# Patient Record
Sex: Female | Born: 1990 | Race: Black or African American | Hispanic: No | Marital: Married | Smoking: Never smoker
Health system: Southern US, Community
[De-identification: ages and names within clinical notes are randomized; demographics above are authoritative.]

## PROBLEM LIST (undated history)

## (undated) DIAGNOSIS — G039 Meningitis, unspecified: Secondary | ICD-10-CM

## (undated) DIAGNOSIS — B019 Varicella without complication: Secondary | ICD-10-CM

## (undated) DIAGNOSIS — L509 Urticaria, unspecified: Secondary | ICD-10-CM

## (undated) HISTORY — DX: Urticaria, unspecified: L50.9

## (undated) HISTORY — PX: HERNIA REPAIR: SHX51

## (undated) HISTORY — DX: Varicella without complication: B01.9

## (undated) HISTORY — PX: OTHER SURGICAL HISTORY: SHX169

---

## 2017-11-01 ENCOUNTER — Encounter (HOSPITAL_COMMUNITY): Payer: Self-pay

## 2017-11-01 ENCOUNTER — Emergency Department (HOSPITAL_COMMUNITY)
Admission: EM | Admit: 2017-11-01 | Discharge: 2017-11-01 | Disposition: A | Discharge status: Home / Self Care | Payer: Self-pay | Attending: Emergency Medicine | Admitting: Emergency Medicine

## 2017-11-01 ENCOUNTER — Other Ambulatory Visit: Payer: Self-pay

## 2017-11-01 DIAGNOSIS — J111 Influenza due to unidentified influenza virus with other respiratory manifestations: Secondary | ICD-10-CM | POA: Insufficient documentation

## 2017-11-01 HISTORY — DX: Meningitis, unspecified: G03.9

## 2017-11-01 LAB — COMPREHENSIVE METABOLIC PANEL
ALT: 12 U/L — ABNORMAL LOW (ref 14–54)
ANION GAP: 9 (ref 5–15)
AST: 22 U/L (ref 15–41)
Albumin: 4.3 g/dL (ref 3.5–5.0)
Alkaline Phosphatase: 52 U/L (ref 38–126)
BUN: 9 mg/dL (ref 6–20)
CHLORIDE: 102 mmol/L (ref 101–111)
CO2: 24 mmol/L (ref 22–32)
Calcium: 9.4 mg/dL (ref 8.9–10.3)
Creatinine, Ser: 0.89 mg/dL (ref 0.44–1.00)
Glucose, Bld: 88 mg/dL (ref 65–99)
POTASSIUM: 3.7 mmol/L (ref 3.5–5.1)
Sodium: 135 mmol/L (ref 135–145)
Total Bilirubin: 0.4 mg/dL (ref 0.3–1.2)
Total Protein: 8 g/dL (ref 6.5–8.1)

## 2017-11-01 LAB — CBC
HEMATOCRIT: 39.6 % (ref 36.0–46.0)
Hemoglobin: 13.8 g/dL (ref 12.0–15.0)
MCH: 31.3 pg (ref 26.0–34.0)
MCHC: 34.8 g/dL (ref 30.0–36.0)
MCV: 89.8 fL (ref 78.0–100.0)
Platelets: 203 10*3/uL (ref 150–400)
RBC: 4.41 MIL/uL (ref 3.87–5.11)
RDW: 12.6 % (ref 11.5–15.5)
WBC: 4.5 10*3/uL (ref 4.0–10.5)

## 2017-11-01 LAB — URINALYSIS, ROUTINE W REFLEX MICROSCOPIC
BILIRUBIN URINE: NEGATIVE
Glucose, UA: NEGATIVE mg/dL
HGB URINE DIPSTICK: NEGATIVE
KETONES UR: 5 mg/dL — AB
NITRITE: NEGATIVE
PH: 5 (ref 5.0–8.0)
Protein, ur: NEGATIVE mg/dL
Specific Gravity, Urine: 1.016 (ref 1.005–1.030)

## 2017-11-01 LAB — LIPASE, BLOOD: LIPASE: 27 U/L (ref 11–51)

## 2017-11-01 LAB — I-STAT BETA HCG BLOOD, ED (MC, WL, AP ONLY): I-stat hCG, quantitative: <5 m[IU]/mL (ref <5)

## 2017-11-01 MED ORDER — ONDANSETRON HCL 8 MG PO TABS: 8.0000 mg | ORAL_TABLET | Freq: Three times a day (TID) | ORAL | 0 refills | Status: DC | PRN

## 2017-11-01 NOTE — ED Provider Notes (Signed)
[facility] EMERGENCY DEPARTMENT Provider Note   CSN: [id number] Arrival date & time: 11/01/17  4098     History   Chief Complaint Chief Complaint  Patient presents with  . Emesis  . Generalized Body Aches    HPI Kristine Curry is a 27 y.o. female.  She has been ill for 3 days with fever, achiness, cough, nausea and vomiting.  She gets transient improvement after taking Tylenol.  She is worried that she has meningitis because she had that 2 years ago, treated in [place], [place].  There are no other known modifying factors.  HPI  Past Medical History:  Diagnosis Date  . Meningitis     There are no active problems to display for this patient.   Past Surgical History:  Procedure Laterality Date  . HERNIA REPAIR    . Stye Removal      OB History    No data available       Home Medications    Prior to Admission medications   Medication Sig Start Date End Date Taking? Authorizing Provider  ondansetron (ZOFRAN) 8 MG tablet Take 1 tablet (8 mg total) by mouth every 8 (eight) hours as needed for nausea or vomiting. 11/01/17   Mancel Bale, MD    Family History No family history on file.  Social History Social History   Tobacco Use  . Smoking status: Never Smoker  Substance Use Topics  . Alcohol use: No    Frequency: Never  . Drug use: Yes    Types: Marijuana     Allergies   Penicillins   Review of Systems Review of Systems  All other systems reviewed and are negative.    Physical Exam Updated Vital Signs BP 125/82 (BP Location: Right Arm)   Pulse 70   Temp 98.6 F (37 C) (Oral)   Resp 18   LMP 10/11/2017 (Within Days)   SpO2 99%   Physical Exam  Constitutional: She is oriented to person, place, and time. She appears well-developed and well-nourished. No distress (Nontoxic).  HENT:  Head: Normocephalic and atraumatic.  Eyes: Conjunctivae and EOM are normal. Pupils are equal, round, and reactive to  light.  Neck: Normal range of motion and phonation normal. Neck supple.  No meningismus.  Cardiovascular: Normal rate and regular rhythm.  Pulmonary/Chest: Effort normal and breath sounds normal. She exhibits no tenderness.  Abdominal: Soft. She exhibits no distension. There is no tenderness. There is no guarding.  Musculoskeletal: Normal range of motion.  Neurological: She is alert and oriented to person, place, and time. She exhibits normal muscle tone.  Skin: Skin is warm and dry.  Psychiatric: She has a normal mood and affect. Her behavior is normal. Judgment and thought content normal.  Nursing note and vitals reviewed.    ED Treatments / Results  Labs (all labs ordered are listed, but only abnormal results are displayed) Labs Reviewed  URINALYSIS, ROUTINE W REFLEX MICROSCOPIC - Abnormal; Notable for the following components:      Result Value   APPearance HAZY (*)    Ketones, ur 5 (*)    Leukocytes, UA TRACE (*)    Bacteria, UA RARE (*)    Squamous Epithelial / LPF 0-5 (*)    All other components within normal limits  CBC  LIPASE, BLOOD  COMPREHENSIVE METABOLIC PANEL  I-STAT BETA HCG BLOOD, ED ([facility], [facility], [facility] ONLY)    EKG  EKG Interpretation None       Radiology No  results found.  Procedures Procedures (including critical care time)  Medications Ordered in ED Medications - No data to display   Initial Impression / Assessment and Plan / ED Course  I have reviewed the triage vital signs and the nursing notes.  Pertinent labs & imaging results that were available during my care of the patient were reviewed by me and considered in my medical decision making (see chart for details).      Patient Vitals for the past 24 hrs:  BP Temp Temp src Pulse Resp SpO2  11/01/17 0837 125/82 98.6 F (37 C) Oral 70 18 99 %    9:39 AM Reevaluation with update and discussion. After initial assessment and treatment, an updated evaluation reveals no change in clinical status;  findings discussed with patient all questions answered. Mancel BaleElliott Anaika Santillano      Final Clinical Impressions(s) / ED Diagnoses   Final diagnoses:  Influenza   Evaluation is consistent with seasonal infection, influenza.  Doubt meningitis, serious bacterial infection or impending vascular collapse.  Patient does not has risk factors requiring hospitalization, or treatment with Tamiflu.  Nursing Notes Reviewed/ Care Coordinated Applicable Imaging Reviewed Interpretation of Laboratory Data incorporated into ED treatment  The patient appears reasonably screened and/or stabilized for discharge and I doubt any other medical condition or other [facility] requiring further screening, evaluation, or treatment in the ED at this time prior to discharge.  Plan: Home Medications-OTC analgesia; Home Treatments-rest, fluids; return here if the recommended treatment, does not improve the symptoms; Recommended follow up-P, PRN   ED Discharge Orders        Ordered    ondansetron (ZOFRAN) 8 MG tablet  Every 8 hours PRN     11/01/17 0940       Mancel BaleWentz, Mckinnley Cottier, MD 11/01/17 [phone]

## 2017-11-01 NOTE — ED Notes (Signed)
Pt reports she started getting a cough and generalized pain on Friday with fatigue starting on Saturday.

## 2017-11-01 NOTE — ED Notes (Signed)
Pt states she understands instructions. Home stable with steady gait with friend.. 

## 2017-11-01 NOTE — ED Notes (Signed)
ED Provider at bedside. 

## 2017-11-01 NOTE — Discharge Instructions (Signed)
Sure that you are getting plenty of rest and drinking a lot of fluids.  Take Tylenol every 4 hours, for fever.  You can also use ibuprofen 2 or 3 times a day to help with muscle aches.  Return here or see the doctor of your choice if needed for problems.

## 2017-11-01 NOTE — ED Triage Notes (Signed)
Per Pt, Pt is coming from home with complaints of generalized body aches, nausea, vomiting, and cough x 2 days.

## 2017-12-22 ENCOUNTER — Other Ambulatory Visit: Payer: Self-pay

## 2017-12-22 ENCOUNTER — Encounter (HOSPITAL_COMMUNITY): Payer: Self-pay

## 2017-12-22 DIAGNOSIS — A084 Viral intestinal infection, unspecified: Secondary | ICD-10-CM | POA: Insufficient documentation

## 2017-12-22 LAB — COMPREHENSIVE METABOLIC PANEL
ALT: 11 U/L — AB (ref 14–54)
AST: 19 U/L (ref 15–41)
Albumin: 4.1 g/dL (ref 3.5–5.0)
Alkaline Phosphatase: 50 U/L (ref 38–126)
Anion gap: 9 (ref 5–15)
BUN: 9 mg/dL (ref 6–20)
CHLORIDE: 104 mmol/L (ref 101–111)
CO2: 24 mmol/L (ref 22–32)
CREATININE: 0.9 mg/dL (ref 0.44–1.00)
Calcium: 9 mg/dL (ref 8.9–10.3)
GFR calc Af Amer: >60 mL/min (ref >60)
GFR calc non Af Amer: >60 mL/min (ref >60)
GLUCOSE: 89 mg/dL (ref 65–99)
Potassium: 3.4 mmol/L — ABNORMAL LOW (ref 3.5–5.1)
SODIUM: 137 mmol/L (ref 135–145)
Total Bilirubin: 0.6 mg/dL (ref 0.3–1.2)
Total Protein: 6.8 g/dL (ref 6.5–8.1)

## 2017-12-22 LAB — URINALYSIS, ROUTINE W REFLEX MICROSCOPIC
Bilirubin Urine: NEGATIVE
GLUCOSE, UA: NEGATIVE mg/dL
HGB URINE DIPSTICK: NEGATIVE
Ketones, ur: 5 mg/dL — AB
Leukocytes, UA: NEGATIVE
Nitrite: NEGATIVE
PROTEIN: NEGATIVE mg/dL
Specific Gravity, Urine: 1.015 (ref 1.005–1.030)
pH: 5 (ref 5.0–8.0)

## 2017-12-22 LAB — CBC
HCT: 36.7 % (ref 36.0–46.0)
Hemoglobin: 12.3 g/dL (ref 12.0–15.0)
MCH: 30.7 pg (ref 26.0–34.0)
MCHC: 33.5 g/dL (ref 30.0–36.0)
MCV: 91.5 fL (ref 78.0–100.0)
PLATELETS: 214 10*3/uL (ref 150–400)
RBC: 4.01 MIL/uL (ref 3.87–5.11)
RDW: 12.7 % (ref 11.5–15.5)
WBC: 5.4 10*3/uL (ref 4.0–10.5)

## 2017-12-22 LAB — LIPASE, BLOOD: Lipase: 23 U/L (ref 11–51)

## 2017-12-22 LAB — I-STAT BETA HCG BLOOD, ED (MC, WL, AP ONLY): I-stat hCG, quantitative: <5 m[IU]/mL (ref <5)

## 2017-12-22 NOTE — ED Triage Notes (Signed)
Pt states that since early this morning she has been having lower abd pian, vomited x 5 and diarrhea x 5. Denies dysuria

## 2017-12-23 ENCOUNTER — Emergency Department (HOSPITAL_COMMUNITY)
Admission: EM | Admit: 2017-12-23 | Discharge: 2017-12-23 | Disposition: A | Discharge status: Home / Self Care | Payer: Self-pay | Attending: Emergency Medicine | Admitting: Emergency Medicine

## 2017-12-23 DIAGNOSIS — A084 Viral intestinal infection, unspecified: Secondary | ICD-10-CM

## 2017-12-23 MED ORDER — PROMETHAZINE HCL 25 MG PO TABS: 25.0000 mg | ORAL_TABLET | Freq: Four times a day (QID) | ORAL | 0 refills | Status: DC | PRN

## 2017-12-23 MED ORDER — ONDANSETRON 4 MG PO TBDP
8.0000 mg | ORAL_TABLET | Freq: Once | ORAL | Status: AC
  Administered 2017-12-23: 8 mg via ORAL
  Filled 2017-12-23: qty 2

## 2017-12-23 NOTE — ED Notes (Signed)
Pt given water 

## 2017-12-23 NOTE — ED Provider Notes (Signed)
[facility] Provider Note   CSN: [id number] Arrival date & time: 12/22/17  1841     History   Chief Complaint Chief Complaint  Patient presents with  . Abdominal Pain    HPI Kristine Curry is a 27 y.o. female.   27 year old female presents to the emergency department for evaluation of vomiting and diarrhea.  Vomiting began 24 hours ago, waking the patient from sleep.  She has had approximately 5 episodes of vomiting, the last of which was streaked with some bright red blood secondary to dry heaves.  She has had nonbloody diarrhea x5 which began about 1 hour after onset of emesis.  She states that her boyfriend has been sick with similar symptoms.  She does note some associated abdominal cramping.  No fever, dysuria, hematuria, vaginal bleeding, vaginal discharge.  Abdominal surgical history significant for hernia repair.  No medications taken prior to arrival for symptoms.  Last episode of emesis was around midnight tonight.      Past Medical History:  Diagnosis Date  . Meningitis     There are no active problems to display for this patient.   Past Surgical History:  Procedure Laterality Date  . HERNIA REPAIR    . Stye Removal      OB History    No data available       Home Medications    Prior to Admission medications   Medication Sig Start Date End Date Taking? Authorizing Provider  ondansetron (ZOFRAN) 8 MG tablet Take 1 tablet (8 mg total) by mouth every 8 (eight) hours as needed for nausea or vomiting. Patient not taking: Reported on 12/23/2017 11/01/17   Mancel BaleWentz, Elliott, MD  promethazine (PHENERGAN) 25 MG tablet Take 1 tablet (25 mg total) by mouth every 6 (six) hours as needed for nausea or vomiting. 12/23/17   Antony MaduraHumes, Nellene Courtois, PA-C    Family History No family history on file.  Social History Social History   Tobacco Use  . Smoking status: Never Smoker  . Smokeless tobacco: Never Used  Substance Use Topics  .  Alcohol use: No    Frequency: Never  . Drug use: Yes    Types: Marijuana     Allergies   Penicillins and Mushroom extract complex   Review of Systems Review of Systems Ten systems reviewed and are negative for acute change, except as noted in the HPI.    Physical Exam Updated Vital Signs BP 115/80   Pulse 74   Temp 98 F (36.7 C) (Oral)   Resp 17   SpO2 95%   Physical Exam  Constitutional: She is oriented to person, place, and time. She appears well-developed and well-nourished. No distress.  Nontoxic appearing and in NAD  HENT:  Head: Normocephalic and atraumatic.  Eyes: Conjunctivae and EOM are normal. No scleral icterus.  Neck: Normal range of motion.  Cardiovascular: Normal rate, regular rhythm and intact distal pulses.  Pulmonary/Chest: Effort normal. No stridor. No respiratory distress. She has no wheezes. She has no rales.  Lungs CTAB  Abdominal: Soft.  Minimal TTP in BLQ. No distension or guarding. No masses or peritoneal signs.  Musculoskeletal: Normal range of motion.  Neurological: She is alert and oriented to person, place, and time. She exhibits normal muscle tone. Coordination normal.  Skin: Skin is warm and dry. No rash noted. She is not diaphoretic. No erythema. No pallor.  Psychiatric: She has a normal mood and affect. Her behavior is normal.  Nursing note  and vitals reviewed.    ED Treatments / Results  Labs (all labs ordered are listed, but only abnormal results are displayed) Labs Reviewed  COMPREHENSIVE METABOLIC PANEL - Abnormal; Notable for the following components:      Result Value   Potassium 3.4 (*)    ALT 11 (*)    All other components within normal limits  URINALYSIS, ROUTINE W REFLEX MICROSCOPIC - Abnormal; Notable for the following components:   Ketones, ur 5 (*)    All other components within normal limits  LIPASE, BLOOD  CBC  I-STAT BETA HCG BLOOD, ED ([facility], [facility], [facility] ONLY)    EKG  EKG Interpretation None        Radiology No results found.  Procedures Procedures (including critical care time)  Medications Ordered in ED Medications  ondansetron (ZOFRAN-ODT) disintegrating tablet 8 mg (8 mg Oral Given 12/23/17 0511)     Initial Impression / Assessment and Plan / ED Course  I have reviewed the triage vital signs and the nursing notes.  Pertinent labs & imaging results that were available during my care of the patient were reviewed by me and considered in my medical decision making (see chart for details).     Patient with symptoms consistent with viral gastroenteritis.  Hx of sick contacts showing similar symptoms.  Vitals are stable, no fever.  No clinical signs of dehydration. Tolerating PO fluids after Zofran.  Abdomen soft without masses or peritoneal signs.  Labs reassuring without leukocytosis or electrolyte derangements.  Liver and kidney function preserved.  Doubt emergent etiology of symptoms.  Will continue with outpatient management with Phenergan.  Primary care follow up advised.  Return precautions discussed and provided. Patient discharged in stable condition with no unaddressed concerns.   Final Clinical Impressions(s) / ED Diagnoses   Final diagnoses:  Viral gastroenteritis    ED Discharge Orders        Ordered    promethazine (PHENERGAN) 25 MG tablet  Every 6 hours PRN     12/23/17 0537       Antony Madura, PA-C 12/23/17 0540    Zadie Rhine, MD 12/23/17 2013

## 2018-01-05 ENCOUNTER — Emergency Department (HOSPITAL_COMMUNITY)
Admission: EM | Admit: 2018-01-05 | Discharge: 2018-01-05 | Disposition: A | Discharge status: Home / Self Care | Payer: No Typology Code available for payment source | Attending: Emergency Medicine | Admitting: Emergency Medicine

## 2018-01-05 ENCOUNTER — Encounter (HOSPITAL_COMMUNITY): Payer: Self-pay | Admitting: Emergency Medicine

## 2018-01-05 ENCOUNTER — Emergency Department (HOSPITAL_COMMUNITY): Payer: No Typology Code available for payment source

## 2018-01-05 DIAGNOSIS — M25511 Pain in right shoulder: Secondary | ICD-10-CM

## 2018-01-05 DIAGNOSIS — M542 Cervicalgia: Secondary | ICD-10-CM | POA: Diagnosis not present

## 2018-01-05 DIAGNOSIS — R51 Headache: Secondary | ICD-10-CM | POA: Insufficient documentation

## 2018-01-05 DIAGNOSIS — M62838 Other muscle spasm: Secondary | ICD-10-CM | POA: Insufficient documentation

## 2018-01-05 DIAGNOSIS — Y999 Unspecified external cause status: Secondary | ICD-10-CM | POA: Insufficient documentation

## 2018-01-05 DIAGNOSIS — M546 Pain in thoracic spine: Secondary | ICD-10-CM | POA: Diagnosis not present

## 2018-01-05 DIAGNOSIS — Z041 Encounter for examination and observation following transport accident: Secondary | ICD-10-CM | POA: Diagnosis present

## 2018-01-05 DIAGNOSIS — Y939 Activity, unspecified: Secondary | ICD-10-CM | POA: Insufficient documentation

## 2018-01-05 DIAGNOSIS — Y929 Unspecified place or not applicable: Secondary | ICD-10-CM | POA: Diagnosis not present

## 2018-01-05 DIAGNOSIS — M7918 Myalgia, other site: Secondary | ICD-10-CM

## 2018-01-05 MED ORDER — METHOCARBAMOL 500 MG PO TABS: 500.0000 mg | ORAL_TABLET | Freq: Two times a day (BID) | ORAL | 0 refills | Status: DC

## 2018-01-05 MED ORDER — IBUPROFEN 200 MG PO TABS
600.0000 mg | ORAL_TABLET | Freq: Once | ORAL | Status: AC
  Administered 2018-01-05: 600 mg via ORAL
  Filled 2018-01-05: qty 3
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
## 2018-01-05 NOTE — Discharge Instructions (Signed)
The pain your experiencing is likely due to muscle strain, you may take Ibuprofen and Robaxin as needed for pain management. Do not combine with any pain reliever other than tylenol. The muscle soreness should improve over the next week. Follow up with your family doctor in the next week for a recheck if you are still having symptoms. Return to ED if pain is worsening, you develop weakness or numbness of extremities, or new or concerning symptoms develop. °
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
## 2018-02-09 ENCOUNTER — Other Ambulatory Visit: Payer: Self-pay

## 2018-02-09 ENCOUNTER — Emergency Department (HOSPITAL_COMMUNITY): Payer: Self-pay

## 2018-02-09 ENCOUNTER — Encounter (HOSPITAL_COMMUNITY): Payer: Self-pay | Admitting: Emergency Medicine

## 2018-02-09 ENCOUNTER — Emergency Department (HOSPITAL_COMMUNITY)
Admission: EM | Admit: 2018-02-09 | Discharge: 2018-02-09 | Disposition: A | Discharge status: Home / Self Care | Payer: Self-pay | Attending: Emergency Medicine | Admitting: Emergency Medicine

## 2018-02-09 DIAGNOSIS — R0981 Nasal congestion: Secondary | ICD-10-CM | POA: Insufficient documentation

## 2018-02-09 DIAGNOSIS — J029 Acute pharyngitis, unspecified: Secondary | ICD-10-CM | POA: Insufficient documentation

## 2018-02-09 DIAGNOSIS — J40 Bronchitis, not specified as acute or chronic: Secondary | ICD-10-CM | POA: Insufficient documentation

## 2018-02-09 LAB — GROUP A STREP BY PCR: Group A Strep by PCR: NOT DETECTED

## 2018-02-09 MED ORDER — PREDNISONE 20 MG PO TABS: 40.0000 mg | ORAL_TABLET | Freq: Every day | ORAL | 0 refills | Status: DC

## 2018-02-09 MED ORDER — IPRATROPIUM-ALBUTEROL 0.5-2.5 (3) MG/3ML IN SOLN
3.0000 mL | Freq: Once | RESPIRATORY_TRACT | Status: AC
Start: 2018-02-09 — End: 2018-02-09
  Administered 2018-02-09: 3 mL via RESPIRATORY_TRACT
  Filled 2018-02-09: qty 3

## 2018-02-09 MED ORDER — ALBUTEROL SULFATE HFA 108 (90 BASE) MCG/ACT IN AERS
1.0000 | INHALATION_SPRAY | Freq: Once | RESPIRATORY_TRACT | Status: AC
  Administered 2018-02-09: 2 via RESPIRATORY_TRACT
  Filled 2018-02-09: qty 6.7

## 2018-02-09 MED ORDER — BENZONATATE 100 MG PO CAPS: 100.0000 mg | ORAL_CAPSULE | Freq: Three times a day (TID) | ORAL | 0 refills | Status: DC

## 2018-02-09 MED ORDER — GUAIFENESIN ER 1200 MG PO TB12: 1.0000 | ORAL_TABLET | Freq: Two times a day (BID) | ORAL | 1 refills | Status: DC | PRN

## 2018-02-09 MED ORDER — PREDNISONE 20 MG PO TABS
60.0000 mg | ORAL_TABLET | Freq: Once | ORAL | Status: AC
  Administered 2018-02-09: 60 mg via ORAL
  Filled 2018-02-09: qty 3

## 2018-02-09 NOTE — ED Provider Notes (Signed)
[facility]-EMERGENCY DEPT Provider Note   CSN: [id number] Arrival date & time: 02/09/18  4098     History   Chief Complaint Chief Complaint  Patient presents with  . Sore Throat  . Cough    HPI Kristine Curry is a 27 y.o. female with no significant past medical history presents emergency department today for nasal congestion, sore throat and cough.  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  She notes over the last 2 days she has been having ear fullness, nasal congestion, rhinorrhea, postnasal drip, sinus pressure, sore throat with associated dysphasia as well as a productive cough.  She has not taken any over-the-counter medication for symptoms.  The patient denies any fever, chills, chest pain, chest tightness, lower leg swelling, recent surgery or travel, immobilization, oral estrogen use, inability to control secretions, nausea/vomiting, voice change or trauma.  HPI  Past Medical History:  Diagnosis Date  . Meningitis     There are no active problems to display for this patient.   Past Surgical History:  Procedure Laterality Date  . HERNIA REPAIR    . Stye Removal       OB History   None      Home Medications    Prior to Admission medications   Medication Sig Start Date End Date Taking? Authorizing Provider  methocarbamol (ROBAXIN) 500 MG tablet Take 1 tablet (500 mg total) by mouth 2 (two) times daily. 01/05/18   Dartha Lodge, PA-C  ondansetron (ZOFRAN) 8 MG tablet Take 1 tablet (8 mg total) by mouth every 8 (eight) hours as needed for nausea or vomiting. Patient not taking: Reported on 12/23/2017 11/01/17   Mancel Bale, MD  promethazine (PHENERGAN) 25 MG tablet Take 1 tablet (25 mg total) by mouth every 6 (six) hours as needed for nausea or vomiting. 12/23/17   Antony Madura, PA-C    Family History No family history on file.  Social History Social History    Tobacco Use  . Smoking status: Never Smoker  . Smokeless tobacco: Never Used  Substance Use Topics  . Alcohol use: No    Frequency: Never  . Drug use: Yes    Types: Marijuana     Allergies   Penicillins and Mushroom extract complex   Review of Systems Review of Systems  All other systems reviewed and are negative.    Physical Exam Updated Vital Signs BP 110/79 (BP Location: Left Arm)   Pulse 72   Temp 98.5 F (36.9 C) (Oral)   Resp 16   LMP 01/09/2018   SpO2 98%   Physical Exam  Constitutional: She appears well-developed and well-nourished.  HENT:  Head: Normocephalic and atraumatic.  Right Ear: Tympanic membrane, external ear and ear canal normal.  Left Ear: Tympanic membrane, external ear and ear canal normal.  Nose: Mucosal edema present. Right sinus exhibits maxillary sinus tenderness. Right sinus exhibits no frontal sinus tenderness. Left sinus exhibits maxillary sinus tenderness. Left sinus exhibits no frontal sinus tenderness.  Mouth/Throat: Uvula is midline, oropharynx is clear and moist and mucous membranes are normal. No tonsillar exudate.  The patient has normal phonation and is in control of secretions. No stridor.  Midline uvula without edema. Soft palate rises symmetrically. 1+ tonsillar erythema without hypertrophy, swelling or exudates. No PTA. Tongue protrusion is normal. No trismus. No creptius on neck palpation and patient has good dentition. No gingival erythema or fluctuance noted.  Mucus membranes moist.   Eyes: Pupils are equal, round, and reactive to light. Right eye exhibits no discharge. Left eye exhibits no discharge. No scleral icterus.  Neck: Trachea normal. Neck supple. No spinous process tenderness present. No neck rigidity. Normal range of motion present.  Cardiovascular: Normal rate, regular rhythm and intact distal pulses.  No murmur heard. Pulses:      Radial pulses are 2+ on the right side, and 2+ on the left side.       Dorsalis  pedis pulses are 2+ on the right side, and 2+ on the left side.       Posterior tibial pulses are 2+ on the right side, and 2+ on the left side.  No lower extremity swelling or edema. Calves symmetric in size bilaterally.  Pulmonary/Chest: Effort normal and breath sounds normal. She exhibits no tenderness.  No increased work of breathing. No accessory muscle use. Patient is sitting upright, speaking in full sentences without difficulty.   Abdominal: Soft. Bowel sounds are normal. There is no tenderness. There is no rebound and no guarding.  Musculoskeletal: She exhibits no edema.  Lymphadenopathy:    She has no cervical adenopathy.  Neurological: She is alert.  Skin: Skin is warm and dry. No rash noted. She is not diaphoretic.  Psychiatric: She has a normal mood and affect.  Nursing note and vitals reviewed.    ED Treatments / Results  Labs (all labs ordered are listed, but only abnormal results are displayed) Labs Reviewed  GROUP A STREP BY PCR    EKG None  Radiology Dg Chest 2 View  Result Date: 02/09/2018 CLINICAL DATA:  Cough EXAM: CHEST - 2 VIEW COMPARISON:  01/05/2018 FINDINGS: The heart size and mediastinal contours are within normal limits. Both lungs are clear. The visualized skeletal structures are unremarkable. IMPRESSION: No active cardiopulmonary disease. Electronically Signed   By: Marlan Palauharles  Clark M.D.   On: 02/09/2018 09:42    Procedures Procedures (including critical care time)  Medications Ordered in ED Medications  ipratropium-albuterol (DUONEB) 0.5-2.5 (3) MG/3ML nebulizer solution 3 mL (has no administration in time range)     Initial Impression / Assessment and Plan / ED Course  I have reviewed the triage vital signs and the nursing notes.  Pertinent labs & imaging results that were available during my care of the patient were reviewed by me and considered in my medical decision making (see chart for details).     27 y.o. female with 2 days of ear  fullness, nasal congestion, rhinorrhea, postnasal drip, sinus pressure, sore throat with associated dysphasia as well as a productive cough.  Patient vital signs are reassuring.  Patient's chest x-ray and strep test negative.  Patient with wheezing on exam.  Patient given albuterol nebulizer as well as steroids in the department with improvement of wheezing.  Do not suspect PTA or RPA.  Will discharge patient home with symptomatic therapy. I advised the patient to follow-up with PCP this week. Specific return precautions discussed. Time was given for all questions to be answered. The patient verbalized understanding and agreement with plan. The patient appears safe for discharge home.  Final Clinical Impressions(s) / ED Diagnoses   Final diagnoses:  Bronchitis    ED Discharge Orders        Ordered    predniSONE (DELTASONE) 20 MG tablet  Daily     02/09/18 1225    Guaifenesin (MUCINEX MAXIMUM STRENGTH) 1200 MG TB12  Every 12 hours PRN  02/09/18 1225    benzonatate (TESSALON) 100 MG capsule  Every 8 hours     02/09/18 1226       Princella Pellegrini 02/09/18 1531    Gwyneth Sprout, MD 02/09/18 2120

## 2018-02-09 NOTE — Discharge Instructions (Signed)
Please read and follow all provided instructions.  Your diagnoses today include:  1. Bronchitis     Tests performed today include: Vital signs. See below for your results today.   Medications prescribed/advised:  1. Mucinex [Guaifenesin] as a decongestant [thin mucus - you have to be well hydrated when taking this for it to work] - take over the counter.  2. Tylenol for fever/pain and Motrin/Ibuprofen for muscle aches.  3. Cough Suppressant: Tessalon - take as directed.  4. Albuterol inhaler - this medication will help open up your airway. Use inhaler as follows: 1-2 puffs with spacer every 4 hours as needed for wheezing, cough, or shortness of breath.  5. Prednisone - take as directed.   Home care instructions:  An upper respiratory infection (URI) is also sometimes known as the common cold. Most people improve within 1 week, but symptoms can last up to 2 weeks. A residual cough may last even longer.   URI is most commonly caused by a virus. Viruses are NOT treated with antibiotics. You can easily spread the virus to others by oral contact. This includes kissing, sharing a glass, coughing, or sneezing. Touching your mouth or nose and then touching a surface, which is then touched by another person, can also spread the virus.   TREATMENT  Treatment is directed at relieving symptoms. There is no cure. Antibiotics are not effective, because the infection is caused by a virus, not by bacteria. Treatment may include:  Increased fluid intake. Sports drinks offer valuable electrolytes, sugars, and fluids.  Breathing heated mist or steam (vaporizer or shower).  Eating chicken soup or other clear broths, and maintaining good nutrition.  Getting plenty of rest.  Using gargles or lozenges for comfort.  Controlling fevers with ibuprofen or acetaminophen as directed by your caregiver.  Increasing usage of your inhaler if you have asthma.  Return to work when your temperature has returned to normal.    Follow-up instructions: Followup with your primary care doctor in 4 days if your symptoms persist.  Your more than welcome to return to the emergency department if symptoms worsen or become concerning.  Return instructions:  Please return to the Emergency Department if you do not get better, if you get worse, or new symptoms OR  - Fever (temperature greater than 101.53F)  - Bleeding that does not stop with holding pressure to the area    -Severe pain (please note that you may be more sore the day after your accident)  - Chest Pain  - Difficulty breathing (worsening shortness of breath with sputum production may be a sign of pneumonia.   - Severe nausea or vomiting  - Inability to tolerate food and liquids  - Passing out  - Skin becoming red around your wounds  - Change in mental status (confusion or lethargy)  - New numbness or weakness     -You develop fever, swollen neck glands, pain with swallowing or white areas on  the back of your throat. This may be a sign of strep throat.  Please return if you have any other emergent concerns.  Additional Information:  Your vital signs today were: BP 110/79 (BP Location: Left Arm)    Pulse 72    Temp 98.5 F (36.9 C) (Oral)    Resp 16    LMP 02/09/2018    SpO2 100%  If your blood pressure (BP) was elevated above 135/85 this visit, please have this repeated by your doctor within one month.

## 2018-02-09 NOTE — ED Triage Notes (Signed)
Pt complaint of sore throat and cough for past 2 days.

## 2018-02-09 NOTE — ED Notes (Signed)
Pt is alert and oriented x 4 and is verbally responsive and is accompanied with friend. Pt is breathing unlabored, pt voice is hoarse and has a dry cough. Pt administered prednisone with water pt was able to tolerate,

## 2018-06-24 ENCOUNTER — Ambulatory Visit: Payer: Self-pay | Admitting: Nurse Practitioner

## 2018-06-24 ENCOUNTER — Encounter: Payer: Self-pay | Admitting: Nurse Practitioner

## 2018-06-24 VITALS — BP 102/76 | HR 79 | Temp 98.3°F | Ht 62.0 in | Wt 119.0 lb

## 2018-06-24 DIAGNOSIS — Z862 Personal history of diseases of the blood and blood-forming organs and certain disorders involving the immune mechanism: Secondary | ICD-10-CM

## 2018-06-24 DIAGNOSIS — Z91018 Allergy to other foods: Secondary | ICD-10-CM

## 2018-06-24 DIAGNOSIS — Z880 Allergy status to penicillin: Secondary | ICD-10-CM

## 2018-06-24 NOTE — Patient Instructions (Addendum)
Go to lab for blood draw. Will provide letter and lab results next week.  You will be contacted to schedule appt with allergist. During this appt, they will be able to determine if you have an anphylactix reaction to penicillin and/or mushrooms.  Sickle Cell Testing Why am I having this test? Your health care provider has determined that you may be at risk for sickle cell disease or sickle cell trait. You will be tested for the presence of hemoglobin S (HbS), which causes sickle cell trait or disease. In sickle cell disease, one abnormal gene is inherited from each of your parents. When a person has sickle cell trait or disease, his or her red blood cells can take the shape of sickles rather than a normal round shape. This allows the cells to get stuck in vessels and cause problems. What kind of sample is taken? A blood sample is required for this test. It is usually collected by inserting a needle into a vein. How do I prepare for this test? You do not need to fast for this test. The test can fail to detect hemoglobin S under certain circumstances. Tell your health care provider:  If you have had a blood transfusion within the past 3-4 months.  If you have a red blood cell disease that increases red blood cell production (polycythemia).  All of the medicines you are taking, such as phenothiazines.  How are the results reported? Your test results will be reported as either positive or negative. It is your responsibility to obtain your test results. Ask the lab or department performing the test when and how you will get your results. What do the results mean? A negative result means that you most likely do not have sickle cell disease or trait. A positive result means that you have hemoglobin S in your blood and that you very likely have sickle cell disease or trait. Talk with your health care provider to discuss your results, treatment options, and if necessary, the need for more tests.  Talk with your health care provider if you have any questions about your results. Talk with your health care provider to discuss your results, treatment options, and if necessary, the need for more tests. Talk with your health care provider if you have any questions about your results. This information is not intended to replace advice given to you by your health care provider. Make sure you discuss any questions you have with your health care provider. Document Released: 10/05/2005 Document Revised: 05/31/2016 Document Reviewed: 02/28/2014 Elsevier Interactive Patient Education  [employer].

## 2018-06-24 NOTE — Progress Notes (Signed)
Subjective:  Patient ID: Kristine Curry, female    DOB: 09/20/1991  Age: 27 y.o. MRN: [id number]  CC: Establish Care (est care/patient is trying to get into military but they need clearnce/lab from a doctor about the trait of sickle cell that she has years ago and also clearnance about allergy she has :mushroom and penicillin. FYI: pap--denied pap,tdap-doesnt remember. )  HPI  Kristine Curry is here to establish care and to discuss tests recommended by [employer] recruit. She is to go to [place] for [employer], but needs to be tested for sickle cell trait and to determine if allergy to PCN and mushroom are life threatening. PCN and mushroom reaction as child. No Hx of anemia Declined CPE today. Reviewed Labs recently done during hospital visit 12/2017.  Exercise: 2x/week, running and strength training.  Reviewed past Medical, Social and Family history today.  Outpatient Medications Prior to Visit  Medication Sig Dispense Refill  . benzonatate (TESSALON) 100 MG capsule Take 1 capsule (100 mg total) by mouth every 8 (eight) hours. (Patient not taking: Reported on 06/24/2018) 21 capsule 0  . Guaifenesin (MUCINEX MAXIMUM STRENGTH) 1200 MG TB12 Take 1 tablet (1,200 mg total) by mouth every 12 (twelve) hours as needed. (Patient not taking: Reported on 06/24/2018) 14 tablet 1  . methocarbamol (ROBAXIN) 500 MG tablet Take 1 tablet (500 mg total) by mouth 2 (two) times daily. (Patient not taking: Reported on 06/24/2018) 20 tablet 0  . ondansetron (ZOFRAN) 8 MG tablet Take 1 tablet (8 mg total) by mouth every 8 (eight) hours as needed for nausea or vomiting. (Patient not taking: Reported on 06/24/2018) 20 tablet 0  . predniSONE (DELTASONE) 20 MG tablet Take 2 tablets (40 mg total) by mouth daily. (Patient not taking: Reported on 06/24/2018) 10 tablet 0  . promethazine (PHENERGAN) 25 MG tablet Take 1 tablet (25 mg total) by mouth every 6 (six) hours as needed for nausea or vomiting. (Patient not  taking: Reported on 06/24/2018) 12 tablet 0   No facility-administered medications prior to visit.     ROS Review of Systems  Constitutional: Negative.   Respiratory: Negative.   Cardiovascular: Negative.   Musculoskeletal: Negative.   Neurological: Negative.   Endo/Heme/Allergies: Negative.   Psychiatric/Behavioral: Negative for depression. The patient is not nervous/anxious and does not have insomnia.    Objective:  BP 102/76   Pulse 79   Temp 98.3 F (36.8 C) (Oral)   Ht 5\' 2"  (1.575 m)   Wt 119 lb (54 kg)   SpO2 99%   BMI 21.77 kg/m   BP Readings from Last 3 Encounters:  06/28/18 110/66  06/24/18 102/76  02/09/18 115/69    Wt Readings from Last 3 Encounters:  06/28/18 120 lb (54.4 kg)  06/24/18 119 lb (54 kg)    Physical Exam  Constitutional: She appears well-developed and well-nourished.  Neck: Normal range of motion. Neck supple. No thyromegaly present.  Cardiovascular: Normal rate, regular rhythm and normal heart sounds.  Pulmonary/Chest: Effort normal and breath sounds normal.  Musculoskeletal: Normal range of motion. She exhibits no edema.  Psychiatric: She has a normal mood and affect. Her behavior is normal.  Vitals reviewed.   Lab Results  Component Value Date   WBC 5.4 12/22/2017   HGB 12.3 12/22/2017   HCT 36.7 12/22/2017   PLT 214 12/22/2017   GLUCOSE 89 12/22/2017   ALT 11 (L) 12/22/2017   AST 19 12/22/2017   NA 137 12/22/2017   K 3.4 (L) 12/22/2017  CL 104 12/22/2017   CREATININE 0.90 12/22/2017   BUN 9 12/22/2017   CO2 24 12/22/2017    Dg Chest 2 View  Result Date: 02/09/2018 CLINICAL DATA:  Cough EXAM: CHEST - 2 VIEW COMPARISON:  01/05/2018 FINDINGS: The heart size and mediastinal contours are within normal limits. Both lungs are clear. The visualized skeletal structures are unremarkable. IMPRESSION: No active cardiopulmonary disease. Electronically Signed   By: Marlan Palau M.D.   On: 02/09/2018 09:42    Assessment & Plan:    [name] was seen today for establish care.  Diagnoses and all orders for this visit:  Hx of sickle cell trait -     Sickle Cell Scr  Penicillin allergy -     Ambulatory referral to Allergy  Food allergy -     Ambulatory referral to Allergy   I have discontinued Ezinne N. Barkow's ondansetron, promethazine, methocarbamol, predniSONE, Guaifenesin, and benzonatate.  No orders of the defined types were placed in this encounter.   Follow-up: Return if symptoms worsen or fail to improve.  Alysia Penna, NP

## 2018-06-25 LAB — SICKLE CELL SCREEN: Sickle Solubility Test - HGBRFX: POSITIVE — AB

## 2018-06-28 ENCOUNTER — Encounter: Payer: Self-pay | Admitting: Allergy and Immunology

## 2018-06-28 ENCOUNTER — Encounter: Payer: Self-pay | Admitting: Nurse Practitioner

## 2018-06-28 ENCOUNTER — Telehealth: Payer: Self-pay | Admitting: Nurse Practitioner

## 2018-06-28 ENCOUNTER — Ambulatory Visit (INDEPENDENT_AMBULATORY_CARE_PROVIDER_SITE_OTHER): Payer: Self-pay | Admitting: Allergy and Immunology

## 2018-06-28 VITALS — BP 110/66 | HR 72 | Temp 98.3°F | Resp 18 | Ht 61.5 in | Wt 120.0 lb

## 2018-06-28 DIAGNOSIS — Z91018 Allergy to other foods: Secondary | ICD-10-CM | POA: Insufficient documentation

## 2018-06-28 DIAGNOSIS — Z889 Allergy status to unspecified drugs, medicaments and biological substances status: Secondary | ICD-10-CM

## 2018-06-28 NOTE — Telephone Encounter (Signed)
Letter ready.

## 2018-06-28 NOTE — Patient Instructions (Addendum)
History of drug allergy  The patient had a low pretest probability and was able to tolerate the open graded penicillin challenge today without adverse signs or symptoms. Therefore, she has the same risk of systemic reaction associated with the use of penicillin as the general population.  The patient has been asked to inform me if she experiences any untoward symptoms over the next 24 to 48 hours.  She has agreed to do so.  History of food allergy Possible food allergy.  The patients history suggests mushroom allergy, though todays skin test was negative.  Food allergen skin testing has excellent negative predictive value.  However, to definitively rule out this allergy we will proceed with an open graded oral challenge.  Until the food allergy has been definitively ruled out, the patient is to continue meticulous avoidance.   Return in about 2 days (around 06/30/2018) for mushroom challenge with Thurston Hole Ambs in [place].

## 2018-06-28 NOTE — Telephone Encounter (Signed)
Please advise 

## 2018-06-28 NOTE — Progress Notes (Signed)
New Patient Note  RE: Kristine Curry MRN: [id number] DOB: 1991/06/25 Date of Office Visit: 06/28/2018  Referring provider: Anne Ng, NP Primary care provider: Anne Ng, NP  Chief Complaint: Allergy Testing   History of present illness: Kristine Curry is a 27 y.o. female seen today in consultation requested by Anne Ng, NP.  She reports that she is applying for application into the [employer] and is here to rule out penicillin allergy and allergen mushroom.  She states that her mother has told her that before she was 73-year-old she had an infection and was given penicillin and developed a rash.  There was no report of cardiopulmonary or GI signs/symptoms.  She has not had penicillin since that time. She states that when she was 27 years old she consumed mushrooms and developed oral pruritus.  This happened on 2 occasions.  She did not experience concomitant urticaria, angioedema, cardiopulmonary symptoms, or other GI symptoms.  Assessment and plan: History of drug allergy  The patient had a low pretest probability and was able to tolerate the open graded penicillin challenge today without adverse signs or symptoms. Therefore, she has the same risk of systemic reaction associated with the use of penicillin as the general population.  The patient has been asked to inform me if she experiences any untoward symptoms over the next 24 to 48 hours.  She has agreed to do so.  History of food allergy Possible food allergy.  The patients history suggests mushroom allergy, though todays skin test was negative.  Food allergen skin testing has excellent negative predictive value.  However, to definitively rule out this allergy we will proceed with an open graded oral challenge.  Until the food allergy has been definitively ruled out, the patient is to continue meticulous avoidance.   Diagnostics: Open graded penicillin challenge: The patient was able  to tolerate the challenge today without adverse signs or symptoms. Vital signs were stable throughout the challenge and observation period.  Mushroom skin testing: Negative.    Physical examination: Blood pressure 110/66, pulse 72, temperature 98.3 F (36.8 C), temperature source Oral, resp. rate 18, height 5' 1.5" (1.562 m), weight 120 lb (54.4 kg), SpO2 99 %.  General: Alert, interactive, in no acute distress. Neck: Supple without lymphadenopathy. Lungs: Clear to auscultation without wheezing, rhonchi or rales. CV: Normal S1, S2 without murmurs. Abdomen: Nondistended, nontender. Skin: Warm and dry, without lesions or rashes. Extremities:  No clubbing, cyanosis or edema. Neuro:   Grossly intact.  Review of systems:  Review of systems negative except as noted in HPI / PMHx or noted below: Review of Systems  Constitutional: Negative.   HENT: Negative.   Eyes: Negative.   Respiratory: Negative.   Cardiovascular: Negative.   Gastrointestinal: Negative.   Genitourinary: Negative.   Musculoskeletal: Negative.   Skin: Negative.   Neurological: Negative.   Endo/Heme/Allergies: Negative.   Psychiatric/Behavioral: Negative.     Past medical history:  Past Medical History:  Diagnosis Date  . Chicken pox   . Meningitis   . Urticaria     Past surgical history:  Past Surgical History:  Procedure Laterality Date  . HERNIA REPAIR    . Stye Removal      Family history: Family History  Problem Relation Age of Onset  . Sickle cell anemia Sister   . Sickle cell trait Brother     Social history: Social History   Socioeconomic History  . Marital status: Married    Spouse name:  Not on file  . Number of children: 2  . Years of education: Not on file  . Highest education level: Not on file  Occupational History  . Not on file  Social Needs  . Financial resource strain: Not on file  . Food insecurity:    Worry: Not on file    Inability: Not on file  . Transportation  needs:    Medical: Not on file    Non-medical: Not on file  Tobacco Use  . Smoking status: Never Smoker  . Smokeless tobacco: Never Used  Substance and Sexual Activity  . Alcohol use: Yes    Frequency: Never    Comment: social  . Drug use: Not Currently    Types: Marijuana    Comment: quit for 2 months now.   . Sexual activity: Yes    Birth control/protection: None  Lifestyle  . Physical activity:    Days per week: Not on file    Minutes per session: Not on file  . Stress: Not on file  Relationships  . Social connections:    Talks on phone: Not on file    Gets together: Not on file    Attends religious service: Not on file    Active member of club or organization: Not on file    Attends meetings of clubs or organizations: Not on file    Relationship status: Not on file  . Intimate partner violence:    Fear of current or ex partner: Not on file    Emotionally abused: Not on file    Physically abused: Not on file    Forced sexual activity: Not on file  Other Topics Concern  . Not on file  Social History Narrative  . Not on file   Environmental History: The patient lives in a 28 year old townhouse with carpeting throughout and central air/heat.  There is no known mold/water damage in the home.  She is a non-smoker without pets.  Allergies as of 06/28/2018      Reactions   Penicillins Anaphylaxis, Rash   Mushroom Extract Complex Hives      Medication List    as of 06/28/2018  3:55 PM   You have not been prescribed any medications.     Known medication allergies: Allergies  Allergen Reactions  . Penicillins Anaphylaxis and Rash  . Mushroom Extract Complex Hives    I appreciate the opportunity to take part in [place] care. Please do not hesitate to contact me with questions.  Sincerely,   R. Jorene Guest, MD

## 2018-06-28 NOTE — Assessment & Plan Note (Addendum)
   The patient had a low pretest probability and was able to tolerate the open graded penicillin challenge today without adverse signs or symptoms. Therefore, she has the same risk of systemic reaction associated with the use of penicillin as the general population.  The patient has been asked to inform me if she experiences any untoward symptoms over the next 24 to 48 hours.  She has agreed to do so.

## 2018-06-28 NOTE — Assessment & Plan Note (Signed)
Possible food allergy.  The patients history suggests mushroom allergy, though todays skin test was negative.  Food allergen skin testing has excellent negative predictive value.  However, to definitively rule out this allergy we will proceed with an open graded oral challenge.  Until the food allergy has been definitively ruled out, the patient is to continue meticulous avoidance.

## 2018-06-28 NOTE — Telephone Encounter (Signed)
Copied from CRM [id number]. Topic: Quick Communication - See Telephone Encounter >> Jun 28, 2018 12:51 PM Tamela Oddi, NT wrote: CRM for notification. See Telephone encounter for: 06/28/18. Patient called and is inquiring about her lab results. She also need a letter stating her sickle cell trait is not life threatening. She is going to the [employer].Please advise. CB# [phone] or [phone]

## 2018-06-30 ENCOUNTER — Ambulatory Visit (INDEPENDENT_AMBULATORY_CARE_PROVIDER_SITE_OTHER): Payer: Self-pay | Admitting: Family Medicine

## 2018-06-30 ENCOUNTER — Encounter: Payer: Self-pay | Admitting: Family Medicine

## 2018-06-30 VITALS — BP 96/60 | HR 92 | Temp 98.5°F | Resp 16 | Ht 62.0 in | Wt 119.5 lb

## 2018-06-30 DIAGNOSIS — Z91018 Allergy to other foods: Secondary | ICD-10-CM

## 2018-06-30 NOTE — Patient Instructions (Addendum)
Food allergy Open graded mushroom oral challenge: The patient was able to tolerate the challenge today without adverse signs or symptoms. Vital signs were stable throughout the challenge and observation period. She received multiple doses separated by , each of which was separated by vitals and a brief physical exam. She received minutes the following doses: lip rub, 0.1g, 0.2 g, 5 g, 10 g, 30 g.  She was monitored for 60 minutes following the last dose.  The patient had negative skin test to mushroom and was able to tolerate the open graded oral challenge today without adverse signs or symptoms. Therefore, she has the same risk of systemic reaction associated with the consumption of mushroom as the general population.  Kristine Curry was able to tolerate the muswhroom food challenge today at the office without adverse sighs or symptoms of an allergic reaction. Therefore, she has the same risk of systemic reaction associated with the consumption of mushrooms as the general population.  - Do not eat any mushrooms or products containing mushrooms for the next 24 hours. - Monitor for allergic symptoms such as rash, wheezing, diarrhea, swelling, and vomiting for the next 24 hours. If severe symptoms occur call 911. For less severe symptoms treat with Benadryl  mg every 4 hours and call the clinic.  - If no allergic symptoms are evident, reintroduce mushrooms into the diet, 1-2 servings a day. If you develop an allergic reaction to mushrooms, record what was eaten the amount eaten, preparation method, time from ingestion to reaction, and symptoms.   Follow up as needed. Call with any questions

## 2018-06-30 NOTE — Progress Notes (Addendum)
[address] [place] [id number] Dept: [phone]  FOLLOW UP NOTE  Patient ID: Kristine Curry, female    DOB: August 05, 1991  Age: 27 y.o. MRN: [id number] Date of Office Visit: 06/30/2018  Assessment  Chief Complaint: Food/Drug Challenge (mushrooms)  HPI Kristine Curry is a 27 year old female who presents to the clinic for a mushroom office food challenge. She was last seen in this office on 06/28/2018 by Dr. Nunzio CobbsBobbitt for a penicillin challenge. At that time, she was able to tolerate the open graded penicillin challenge without adverse signs or symptoms.   She reports that she last had mushrooms in high school. At that time, after ingestion she began to experience tingling in her mouth and itching on her skin that resolved with no medical intervention. She did not experience angioedema, cardiopulmonary, or gastrointestinal symptoms related to mushroom ingestion. She reports that she feels well today.  Her current medications are listed in the chart.   Drug Allergies:  Allergies  Allergen Reactions  . Penicillins Anaphylaxis and Rash  . Mushroom Extract Complex Hives    Physical Exam: BP 96/60   Pulse 92   Temp 98.5 F (36.9 C) (Oral)   Resp 16   Ht 5\' 2"  (1.575 m)   Wt 119 lb 7.8 oz (54.2 kg)   SpO2 98%   BMI 21.85 kg/m    Physical Exam  Constitutional: She is oriented to person, place, and time. She appears well-developed and well-nourished.  HENT:  Head: Normocephalic.  Right Ear: External ear normal.  Left Ear: External ear normal.  Nose: Nose normal.  Mouth/Throat: Oropharynx is clear and moist.  Bilateral nares normal.  Pharynx normal.  Ears normal.  Eyes normal.  Eyes: Conjunctivae are normal.  Neck: Normal range of motion. Neck supple.  Cardiovascular: Normal rate, regular rhythm and normal heart sounds.  No murmur noted  Pulmonary/Chest: Effort normal and breath sounds normal.  Lungs clear to auscultation  Musculoskeletal: Normal range  of motion.  Neurological: She is alert and oriented to person, place, and time.  Skin: Skin is warm and dry.  Psychiatric: She has a normal mood and affect. Her behavior is normal. Judgment and thought content normal.  Vitals reviewed.   Diagnostics: Open graded mushroom challenge.  The patient was able to tolerate the challenge today without adverse signs or symptoms.  Vital signs were stable throughout the challenge and observation period.  Assessment and Plan: 1. History of food allergy      Patient Instructions  Food allergy Open graded mushroom oral challenge: The patient was able to tolerate the challenge today without adverse signs or symptoms. Vital signs were stable throughout the challenge and observation period. She received multiple doses separated by , each of which was separated by vitals and a brief physical exam. She received minutes the following doses: lip rub, 0.1g, 0.2 g, 5 g, 10 g, 30 g.  She was monitored for 60 minutes following the last dose.  The patient had negative skin test to mushroom and was able to tolerate the open graded oral challenge today without adverse signs or symptoms. Therefore, she has the same risk of systemic reaction associated with the consumption of mushroom as the general population.  Kristine Curry was able to tolerate the muswhroom food challenge today at the office without adverse sighs or symptoms of an allergic reaction. Therefore, she has the same risk of systemic reaction associated with the consumption of mushrooms as the general population.  -  Do not eat any mushrooms or products containing mushrooms for the next 24 hours. - Monitor for allergic symptoms such as rash, wheezing, diarrhea, swelling, and vomiting for the next 24 hours. If severe symptoms occur call 911. For less severe symptoms treat with Benadryl  mg every 4 hours and call the clinic.  - If no allergic symptoms are evident, reintroduce mushrooms into the diet, 1-2  servings a day. If you develop an allergic reaction to mushrooms, record what was eaten the amount eaten, preparation method, time from ingestion to reaction, and symptoms.   Follow up as needed. Call with any questions   Return if symptoms worsen or fail to improve.    Thank you for the opportunity to care for this patient.  Please do not hesitate to contact me with questions.  Thermon Leyland, FNP [facility]  _________________________________________________  I have provided oversight concerning Kristine Curry's evaluation and treatment of this patient's health issues addressed during today's encounter.  I agree with the assessment and therapeutic plan as outlined in the note.   Signed,   R Jorene Guest, MD

## 2018-08-08 ENCOUNTER — Encounter (HOSPITAL_COMMUNITY): Payer: Self-pay | Admitting: Emergency Medicine

## 2018-08-08 ENCOUNTER — Ambulatory Visit (HOSPITAL_COMMUNITY)
Admission: EM | Admit: 2018-08-08 | Discharge: 2018-08-08 | Disposition: A | Discharge status: Home / Self Care | Payer: Self-pay | Attending: Family Medicine | Admitting: Family Medicine

## 2018-08-08 DIAGNOSIS — M25531 Pain in right wrist: Secondary | ICD-10-CM

## 2018-08-08 MED ORDER — PREDNISONE 50 MG PO TABS: 50.0000 mg | ORAL_TABLET | Freq: Every day | ORAL | 0 refills | Status: DC

## 2018-08-08 NOTE — Discharge Instructions (Signed)
Start prednisone as directed.  You can continue ibuprofen 800 mg 3 times a day for pain as needed.  As discussed, wrist splint during activity, ice compress, elevation.  Follow-up with PCP for further evaluation and monitoring needed.  If symptoms not improving, follow-up with orthopedics for further evaluation and management needed.

## 2018-08-08 NOTE — ED Triage Notes (Signed)
PT reports wrist pain and swelling. Reports pain shooting up arm and numbness in fingers on right side.   Has been going on for 1 week

## 2018-08-08 NOTE — ED Provider Notes (Signed)
[facility]    CSN: [id number] Arrival date & time: 08/08/18  0803     History   Chief Complaint Chief Complaint  Patient presents with  . Wrist Pain    HPI Kristine Curry is a 27 y.o. female.   27 year old female comes in for 1 week history of right wrist pain. Denies injury/trauma.  Patient states noticed radial swelling a week ago, and started ice compress which helped with the swelling.  She started having pain diffusely around the wrist, worse with movement, relieved by rest.  Has numbness and tingling to the first 3 fingers.  States work requires heavy lifting and repetitive motion.  She tried taking some ibuprofen without relief.  Noticed shooting pain up the arm yesterday, and came in for evaluation.  She denies neck pain.     Past Medical History:  Diagnosis Date  . Chicken pox   . Meningitis   . Urticaria     Patient Active Problem List   Diagnosis Date Noted  . History of drug allergy 06/28/2018  . History of food allergy 06/28/2018    Past Surgical History:  Procedure Laterality Date  . HERNIA REPAIR    . Stye Removal      OB History   None      Home Medications    Prior to Admission medications   Medication Sig Start Date End Date Taking? Authorizing Provider  predniSONE (DELTASONE) 50 MG tablet Take 1 tablet (50 mg total) by mouth daily. 08/08/18   Belinda Fisher, PA-C    Family History Family History  Problem Relation Age of Onset  . Sickle cell anemia Sister   . Sickle cell trait Brother     Social History Social History   Tobacco Use  . Smoking status: Never Smoker  . Smokeless tobacco: Never Used  Substance Use Topics  . Alcohol use: Yes    Frequency: Never    Comment: social  . Drug use: Not Currently    Types: Marijuana    Comment: quit for 2 months now.      Allergies   Penicillins and Mushroom extract complex   Review of Systems Review of Systems  Reason unable to perform ROS: See HPI as above.      Physical Exam Triage Vital Signs ED Triage Vitals  Enc Vitals Group     BP 08/08/18 0819 116/85     Pulse Rate 08/08/18 0819 66     Resp 08/08/18 0819 16     Temp 08/08/18 0819 98.2 F (36.8 C)     Temp Source 08/08/18 0819 Oral     SpO2 08/08/18 0819 98 %     Weight --      Height --      Head Circumference --      Peak Flow --      Pain Score 08/08/18 0820 8     Pain Loc --      Pain Edu? --      Excl. in GC? --    No data found.  Updated Vital Signs BP 116/85   Pulse 66   Temp 98.2 F (36.8 C) (Oral)   Resp 16   LMP 07/29/2018   SpO2 98%   Physical Exam  Constitutional: She is oriented to person, place, and time. She appears well-developed and well-nourished. No distress.  HENT:  Head: Normocephalic and atraumatic.  Eyes: Pupils are equal, round, and reactive to light. Conjunctivae are normal.  Musculoskeletal:  No swelling, erythema, increased warmth, contusion seen.  Tenderness to palpation diffusely, worse on the volar side.  Full range of motion.  Strength deferred.  Sensation intact, "tingling" sensation of the right thumb.  Radial pulse 2+, cap refill less than 2 seconds.  Positive Tinel's. Patient deferred Phalen test  Neurological: She is alert and oriented to person, place, and time.  Skin: She is not diaphoretic.     UC Treatments / Results  Labs (all labs ordered are listed, but only abnormal results are displayed) Labs Reviewed - No data to display  EKG None  Radiology No results found.  Procedures Procedures (including critical care time)  Medications Ordered in UC Medications - No data to display  Initial Impression / Assessment and Plan / UC Course  I have reviewed the triage vital signs and the nursing notes.  Pertinent labs & imaging results that were available during my care of the patient were reviewed by me and considered in my medical decision making (see chart for details).    Discussed possible carpal tunnel causing  symptoms.  Start prednisone as directed.  Ice compress, elevation, wrist splint during activity.  Follow-up with orthopedics for further evaluation if symptoms not improving.  Final Clinical Impressions(s) / UC Diagnoses   Final diagnoses:  Right wrist pain    ED Prescriptions    Medication Sig Dispense Auth. Provider   predniSONE (DELTASONE) 50 MG tablet Take 1 tablet (50 mg total) by mouth daily. 5 tablet Threasa Alpha, [place] 08/08/18 [phone]

## 2018-09-12 ENCOUNTER — Encounter (HOSPITAL_COMMUNITY): Payer: Self-pay | Admitting: Emergency Medicine

## 2018-09-12 ENCOUNTER — Other Ambulatory Visit: Payer: Self-pay

## 2018-09-12 ENCOUNTER — Ambulatory Visit (HOSPITAL_COMMUNITY)
Admission: EM | Admit: 2018-09-12 | Discharge: 2018-09-12 | Disposition: A | Discharge status: Home / Self Care | Payer: Self-pay | Attending: Family Medicine | Admitting: Family Medicine

## 2018-09-12 DIAGNOSIS — B9789 Other viral agents as the cause of diseases classified elsewhere: Secondary | ICD-10-CM

## 2018-09-12 DIAGNOSIS — J069 Acute upper respiratory infection, unspecified: Secondary | ICD-10-CM

## 2018-09-12 MED ORDER — CETIRIZINE-PSEUDOEPHEDRINE ER 5-120 MG PO TB12: 1.0000 | ORAL_TABLET | Freq: Every day | ORAL | 0 refills | Status: AC

## 2018-09-12 MED ORDER — BENZONATATE 100 MG PO CAPS: 100.0000 mg | ORAL_CAPSULE | Freq: Three times a day (TID) | ORAL | 0 refills | Status: AC

## 2018-09-12 NOTE — ED Provider Notes (Signed)
[facility]   [id number] 09/12/18 Arrival Time: 0804  Cc: COUGH  SUBJECTIVE:  Kristine Curry is a 27 y.o. female who presents with cough x 4 days ago.  Admits to positive sick exposure to kids with similar symptoms.  Describes cough as intermittent and dry and productive with clear/yellow sputum.  Has tried OTC zarbys with temporary relief.  Symptoms are made worse at night.  Complains of associated rhinorrhea.  Denies fever, chills, fatigue, sinus pain, sore throat, SOB, wheezing, chest pain, nausea, changes in bowel or bladder habits.    ROS: As per HPI.  Past Medical History:  Diagnosis Date  . Chicken pox   . Meningitis   . Urticaria    Past Surgical History:  Procedure Laterality Date  . HERNIA REPAIR    . Stye Removal     No Active Allergies No current facility-administered medications on file prior to encounter.    No current outpatient medications on file prior to encounter.    Social History   Socioeconomic History  . Marital status: Married    Spouse name: Not on file  . Number of children: 2  . Years of education: Not on file  . Highest education level: Not on file  Occupational History  . Not on file  Social Needs  . Financial resource strain: Not on file  . Food insecurity:    Worry: Not on file    Inability: Not on file  . Transportation needs:    Medical: Not on file    Non-medical: Not on file  Tobacco Use  . Smoking status: Never Smoker  . Smokeless tobacco: Never Used  Substance and Sexual Activity  . Alcohol use: Yes    Frequency: Never    Comment: social  . Drug use: Not Currently    Types: Marijuana    Comment: quit for 2 months now.   . Sexual activity: Yes    Birth control/protection: None  Lifestyle  . Physical activity:    Days per week: Not on file    Minutes per session: Not on file  . Stress: Not on file  Relationships  . Social connections:    Talks on phone: Not on file    Gets together: Not on file    Attends religious service: Not on file    Active member of club or organization: Not on file    Attends meetings of clubs or organizations: Not on file    Relationship status: Not on file  . Intimate partner violence:    Fear of current or ex partner: Not on file    Emotionally abused: Not on file    Physically abused: Not on file    Forced sexual activity: Not on file  Other Topics Concern  . Not on file  Social History Narrative  . Not on file   Family History  Problem Relation Age of Onset  . Sickle cell anemia Sister   . Sickle cell trait Brother      OBJECTIVE:  Vitals:   09/12/18 0831  BP: 111/67  Pulse: 71  Resp: 18  Temp: 98.1 F (36.7 C)  TempSrc: Oral  SpO2: 97%     General appearance: Alert, appears nontoxic; speaking in full sentences without difficulty HEENT:NCAT; Ears: EACs clear, TMs pearly gray; Eyes: PERRL.  EOM grossly intact. Nose: nares patent without rhinorrhea; Throat: tonsils nonerythematous or enlarged, uvula midline  Neck: supple without LAD Lungs: clear to auscultation bilaterally without adventitious breath sounds; normal  respiratory effort; mild cough present Heart: regular rate and rhythm.  Radial pulses 2+ symmetrical bilaterally Skin: warm and dry Psychological: alert and cooperative; normal mood and affect   ASSESSMENT & PLAN:  1. Viral URI with cough     Meds ordered this encounter  Medications  . cetirizine-pseudoephedrine (ZYRTEC-D) 5-120 MG tablet    Sig: Take 1 tablet by mouth daily.    Dispense:  30 tablet    Refill:  0    Order Specific Question:   Supervising Provider    Answer:   Isa Rankin [phone]  . benzonatate (TESSALON) 100 MG capsule    Sig: Take 1 capsule (100 mg total) by mouth every 8 (eight) hours.    Dispense:  21 capsule    Refill:  0    Order Specific Question:   Supervising Provider    Answer:   Isa Rankin [[id number]]     Get plenty of rest and push fluids Zyrtec D prescribed.   Take daily for symptomatic relief. Prescribed tessolone perles as needed for cough Follow up with PCP for recheck and/or if symptoms persists Return or go to ER if you have any new or worsening symptoms such as fever, chills, fatigue, shortness of breath, wheezing, chest pain, nausea, changes in bowel or bladder habits, etc...  Reviewed expectations re: course of current medical issues. Questions answered. Outlined signs and symptoms indicating need for more acute intervention. Patient verbalized understanding. After Visit Summary given.          Rennis Harding, PA-C 09/12/18 [phone]

## 2018-09-12 NOTE — ED Triage Notes (Signed)
Pt reports a cough since Thursday.  She has only taken Zarby's for her symptoms.

## 2018-09-12 NOTE — Discharge Instructions (Signed)
Get plenty of rest and push fluids Zyrtec D prescribed.  Take daily for symptomatic relief. Prescribed tessolone perles as needed for cough Follow up with PCP for recheck and/or if symptoms persists Return or go to ER if you have any new or worsening symptoms such as fever, chills, fatigue, shortness of breath, wheezing, chest pain, nausea, changes in bowel or bladder habits, etc...Marland Kitchen

## 2019-02-15 ENCOUNTER — Telehealth: Payer: Self-pay | Admitting: Nurse Practitioner

## 2019-02-15 NOTE — Telephone Encounter (Signed)
I called and left message on patient voicemail per Charlotte Nche to call office and schedule appointment for CPE.  °

## 2019-11-20 IMAGING — CT CT CERVICAL SPINE W/O CM
4 series · 16 of 33 positions shown, 19 images · non-contrast
Comparison: None.

CLINICAL DATA: Rear ended yesterday evening. Shoulder and back
pain.

EXAM:
CT CERVICAL SPINE WITHOUT CONTRAST
TECHNIQUE: Multidetector CT imaging of the cervical spine was performed without
intravenous contrast. Multiplanar CT image reconstructions were also
generated.

[Series 3: c spine soft · axial · 0.31mm/px · z∈[+1354,+1384]mm · 2 of 75 slices shown]
[im 15/75  soft-tissue]
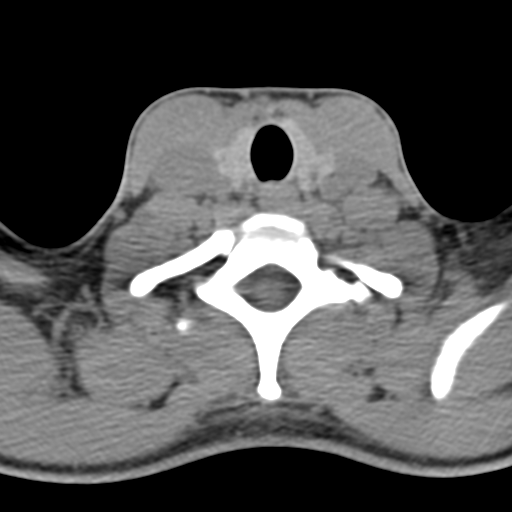
[im 30/75  soft-tissue]
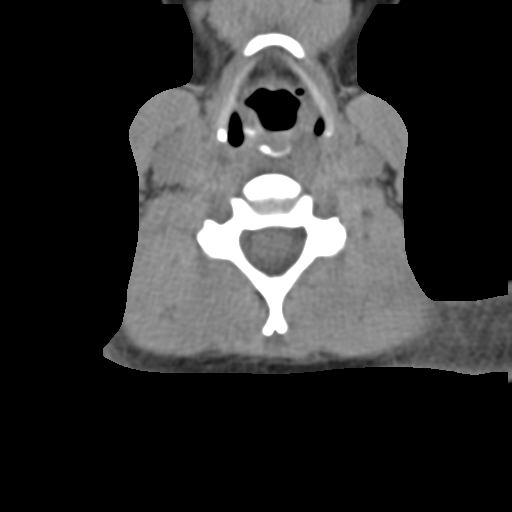

[Series 6: orthogonal bone · axial · 0.23mm/px · z∈[+1322,+1443]mm · 6 of 96 slices shown, 8 images]
[im 14/96  soft-tissue]
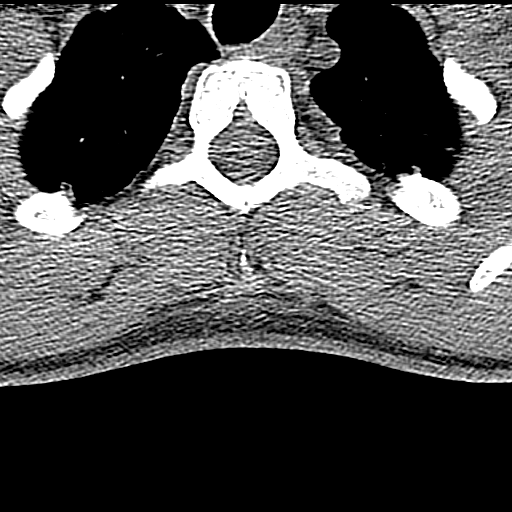
[im 14/96  bone]
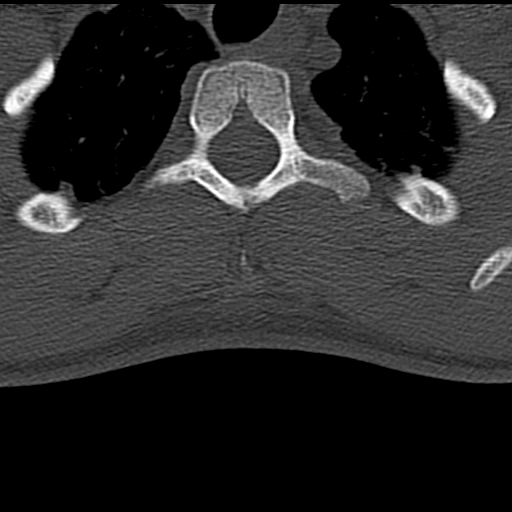
[im 28/96  bone]
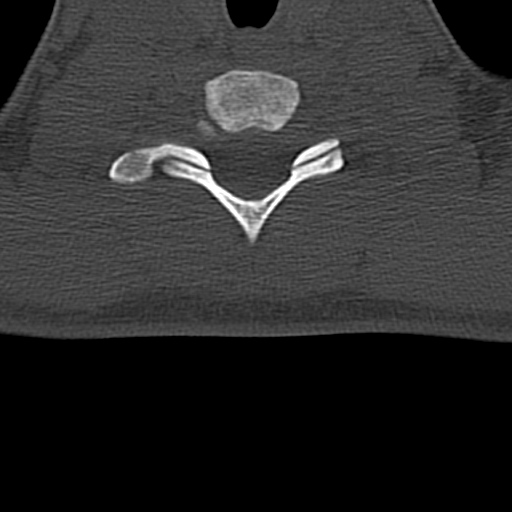
[im 41/96  bone]
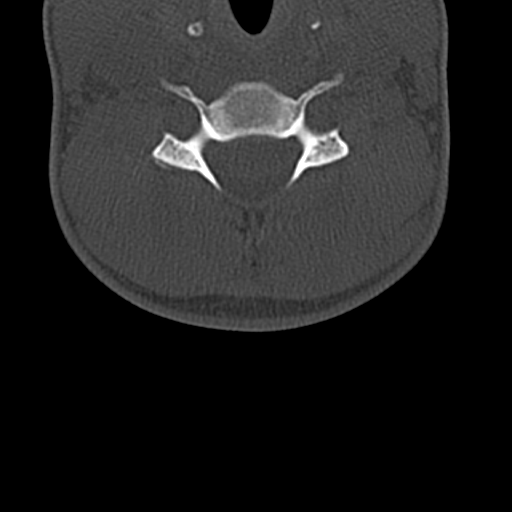
[im 55/96  bone]
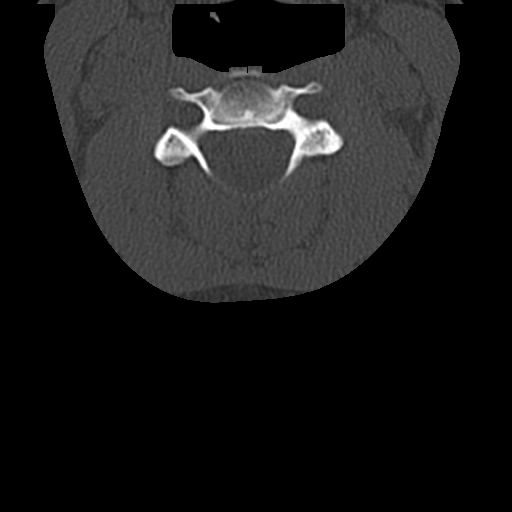
[im 68/96  soft-tissue]
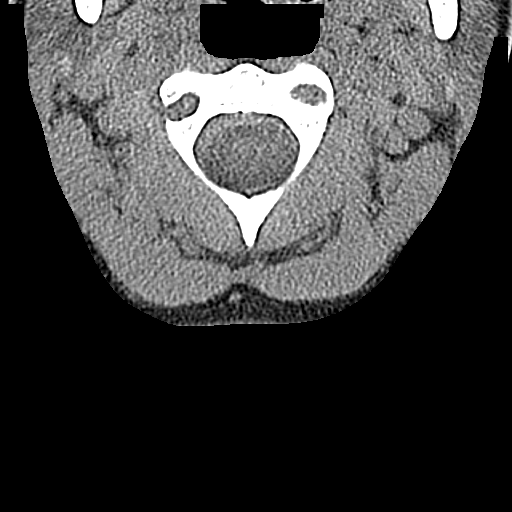
[im 68/96  bone]
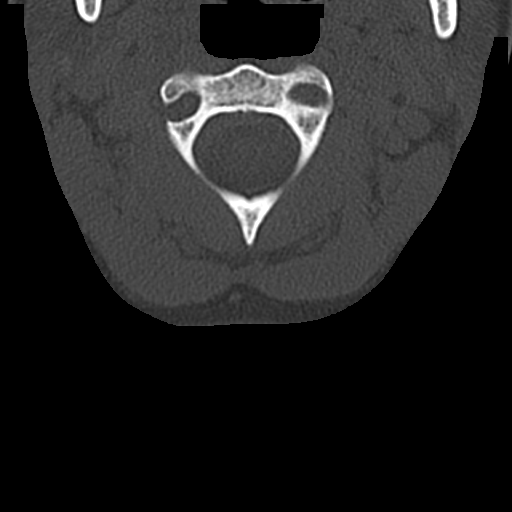
[im 82/96  bone]
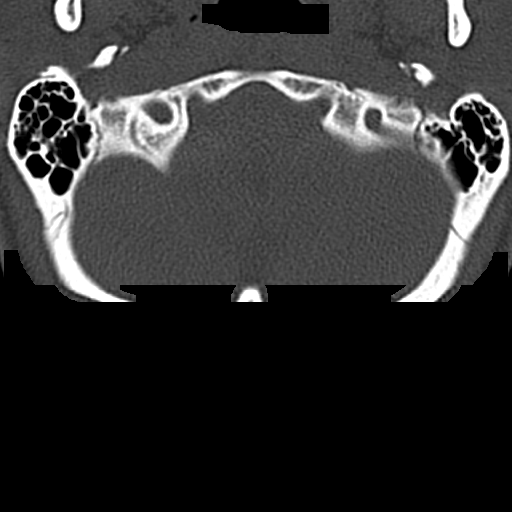

[Series 7: coronal bone · coronal · 0.33mm/px · 3 of 36 slices shown]
[im 8/36  bone]
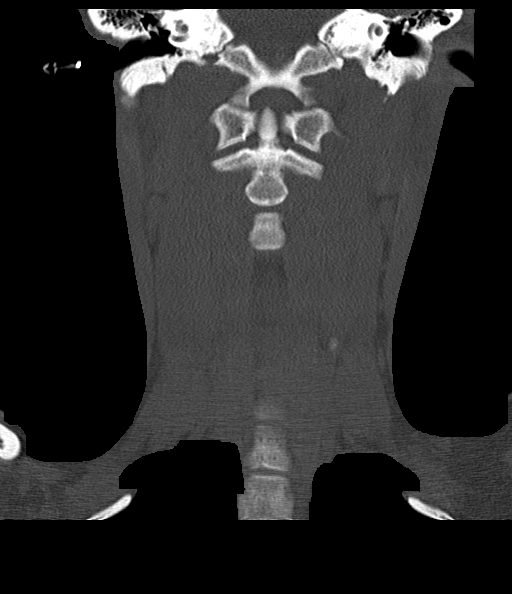
[im 15/36  bone]
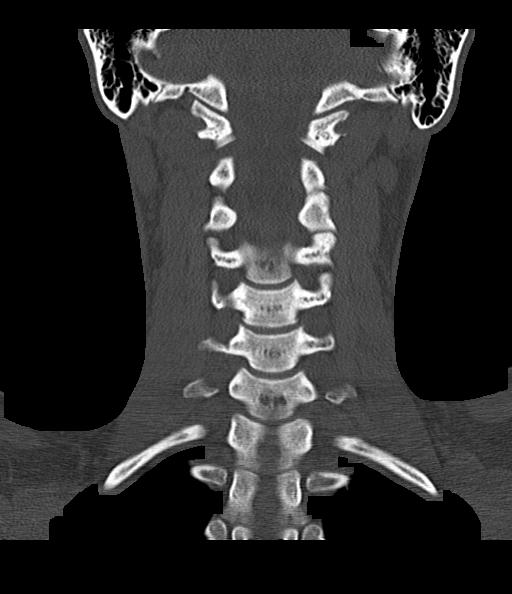
[im 22/36  bone]
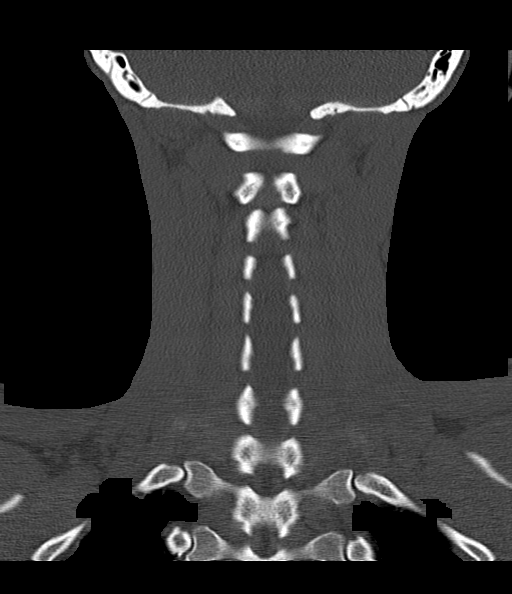

[Series 8: sagittal bone · sagittal · 0.33mm/px · 5 of 39 slices shown, 6 images]
[im 13/39  bone]
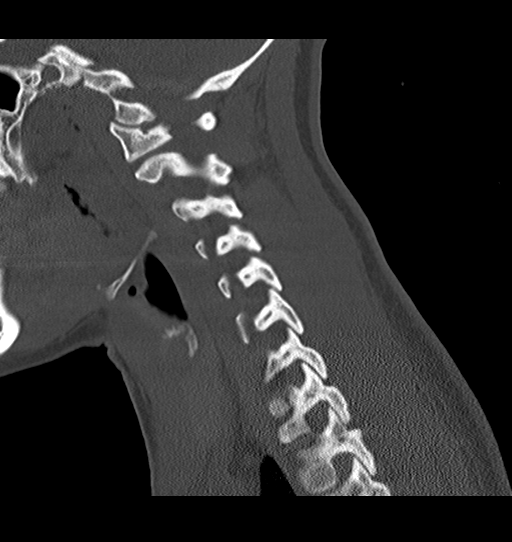
[im 16/39  bone]
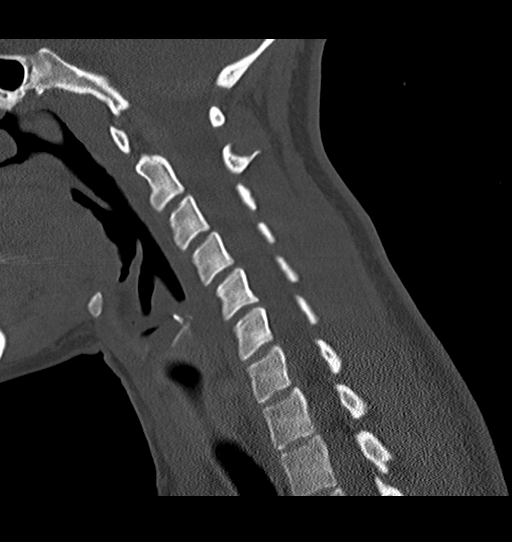
[im 20/39  soft-tissue]
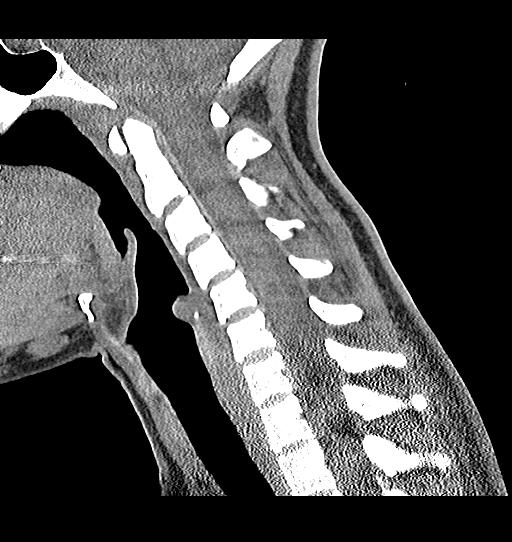
[im 20/39  bone]
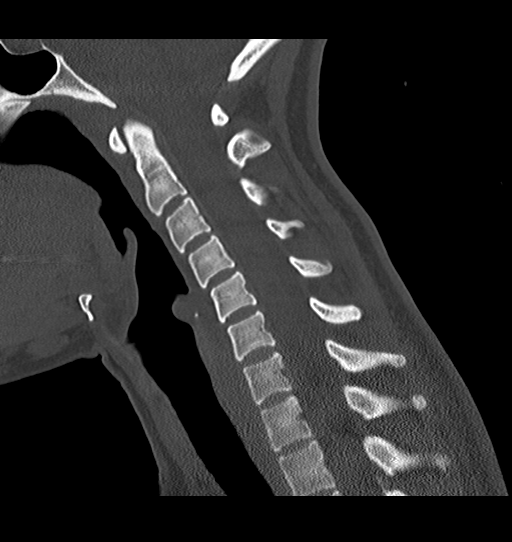
[im 23/39  bone]
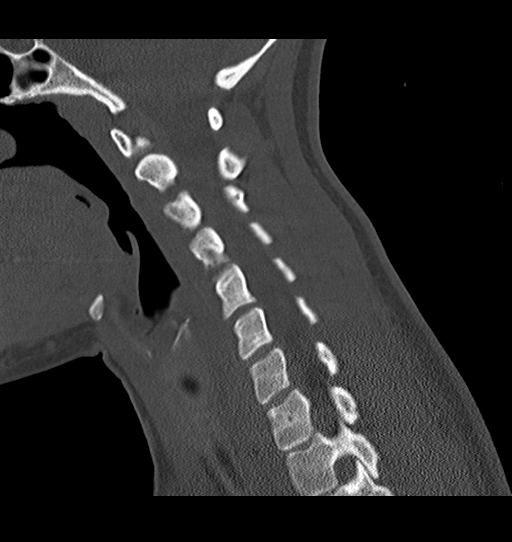
[im 26/39  bone]
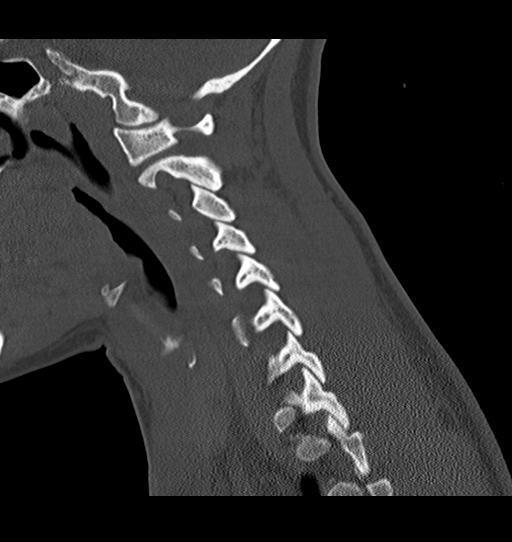

[16 of 33 positions shown; findings below may reference images not displayed]

FINDINGS: Alignment: Slight reversal of the normal cervical lordosis. No
traumatic malalignment.

Skull base and vertebrae: No acute fracture. No primary bone lesion
or focal pathologic process.

Soft tissues and spinal canal: No prevertebral fluid or swelling. No
visible canal hematoma.

Disc levels:  Normal.

Upper chest: Negative.

Other: None.
IMPRESSION: 1.  No acute cervical spine fracture.
2. Slight reversal of the normal cervical lordosis, which can be
seen with muscle spasm.
